# Patient Record
Sex: Male | Born: 1965 | Race: White | Hispanic: No | Marital: Single | Smoking: Never smoker
Health system: Southern US, Community
[De-identification: ages and names within clinical notes are randomized; demographics above are authoritative.]

## PROBLEM LIST (undated history)

## (undated) DIAGNOSIS — I10 Essential (primary) hypertension: Secondary | ICD-10-CM

## (undated) DIAGNOSIS — I252 Old myocardial infarction: Secondary | ICD-10-CM

---

## 2011-01-04 ENCOUNTER — Ambulatory Visit: Payer: Self-pay | Admitting: Internal Medicine

## 2016-03-24 ENCOUNTER — Encounter: Payer: Self-pay | Admitting: Emergency Medicine

## 2016-03-24 ENCOUNTER — Emergency Department
Admission: EM | Admit: 2016-03-24 | Discharge: 2016-03-24 | Disposition: A | Discharge status: Home / Self Care | Payer: Medicare Other | Attending: Emergency Medicine | Admitting: Emergency Medicine

## 2016-03-24 ENCOUNTER — Emergency Department: Payer: Medicare Other

## 2016-03-24 DIAGNOSIS — Y9389 Activity, other specified: Secondary | ICD-10-CM | POA: Insufficient documentation

## 2016-03-24 DIAGNOSIS — S29001A Unspecified injury of muscle and tendon of front wall of thorax, initial encounter: Secondary | ICD-10-CM | POA: Diagnosis present

## 2016-03-24 DIAGNOSIS — Y999 Unspecified external cause status: Secondary | ICD-10-CM | POA: Insufficient documentation

## 2016-03-24 DIAGNOSIS — F17220 Nicotine dependence, chewing tobacco, uncomplicated: Secondary | ICD-10-CM | POA: Insufficient documentation

## 2016-03-24 DIAGNOSIS — Y92410 Unspecified street and highway as the place of occurrence of the external cause: Secondary | ICD-10-CM | POA: Insufficient documentation

## 2016-03-24 DIAGNOSIS — I252 Old myocardial infarction: Secondary | ICD-10-CM | POA: Diagnosis not present

## 2016-03-24 DIAGNOSIS — S20219A Contusion of unspecified front wall of thorax, initial encounter: Secondary | ICD-10-CM | POA: Insufficient documentation

## 2016-03-24 DIAGNOSIS — I10 Essential (primary) hypertension: Secondary | ICD-10-CM | POA: Insufficient documentation

## 2016-03-24 HISTORY — DX: Old myocardial infarction: I25.2

## 2016-03-24 HISTORY — DX: Essential (primary) hypertension: I10

## 2016-03-24 MED ORDER — OXYCODONE-ACETAMINOPHEN 5-325 MG PO TABS: 1.0000 | ORAL_TABLET | ORAL | 0 refills | Status: AC | PRN

## 2016-03-24 MED ORDER — OXYCODONE-ACETAMINOPHEN 5-325 MG PO TABS
2.0000 | ORAL_TABLET | Freq: Once | ORAL | Status: AC
  Administered 2016-03-24: 2 via ORAL
  Filled 2016-03-24: qty 2

## 2016-03-24 MED ORDER — NAPROXEN 500 MG PO TABS: 500.0000 mg | ORAL_TABLET | Freq: Two times a day (BID) | ORAL | 0 refills | Status: AC
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
